# Patient Record
Sex: Female | Born: 1980 | Race: White | Hispanic: No | Marital: Single | Smoking: Never smoker
Health system: Southern US, Community
[De-identification: ages and names within clinical notes are randomized; demographics above are authoritative.]

## PROBLEM LIST (undated history)

## (undated) DIAGNOSIS — S83249A Other tear of medial meniscus, current injury, unspecified knee, initial encounter: Secondary | ICD-10-CM

## (undated) DIAGNOSIS — K219 Gastro-esophageal reflux disease without esophagitis: Secondary | ICD-10-CM

## (undated) DIAGNOSIS — R609 Edema, unspecified: Secondary | ICD-10-CM

## (undated) DIAGNOSIS — S83289A Other tear of lateral meniscus, current injury, unspecified knee, initial encounter: Secondary | ICD-10-CM

## (undated) DIAGNOSIS — R600 Localized edema: Secondary | ICD-10-CM

## (undated) DIAGNOSIS — S83519A Sprain of anterior cruciate ligament of unspecified knee, initial encounter: Secondary | ICD-10-CM

## (undated) DIAGNOSIS — R682 Dry mouth, unspecified: Secondary | ICD-10-CM

## (undated) HISTORY — PX: ANAL FISTULECTOMY: SHX1139

## (undated) HISTORY — PX: TONSILLECTOMY: SUR1361

## (undated) HISTORY — PX: KNEE ARTHROSCOPY: SHX127

---

## 2003-04-14 ENCOUNTER — Encounter: Admission: RE | Admit: 2003-04-14 | Discharge: 2003-04-14 | Payer: Self-pay | Admitting: Unknown Physician Specialty

## 2003-04-14 ENCOUNTER — Encounter: Payer: Self-pay | Admitting: Unknown Physician Specialty

## 2014-02-15 ENCOUNTER — Other Ambulatory Visit: Payer: Self-pay | Admitting: Orthopaedic Surgery

## 2014-02-15 DIAGNOSIS — M25562 Pain in left knee: Secondary | ICD-10-CM

## 2014-02-22 ENCOUNTER — Ambulatory Visit
Admission: RE | Admit: 2014-02-22 | Discharge: 2014-02-22 | Disposition: A | Discharge status: Home / Self Care | Payer: Medicaid Other | Source: Ambulatory Visit | Attending: Orthopaedic Surgery | Admitting: Orthopaedic Surgery

## 2014-02-22 DIAGNOSIS — M25562 Pain in left knee: Secondary | ICD-10-CM

## 2015-03-20 HISTORY — PX: DIAGNOSTIC LAPAROSCOPY: SUR761

## 2015-04-24 ENCOUNTER — Other Ambulatory Visit: Payer: Self-pay | Admitting: Orthopaedic Surgery

## 2015-04-24 DIAGNOSIS — M25562 Pain in left knee: Secondary | ICD-10-CM

## 2015-05-03 ENCOUNTER — Ambulatory Visit
Admission: RE | Admit: 2015-05-03 | Discharge: 2015-05-03 | Disposition: A | Discharge status: Home / Self Care | Payer: Medicaid Other | Source: Ambulatory Visit | Attending: Orthopaedic Surgery | Admitting: Orthopaedic Surgery

## 2015-05-03 DIAGNOSIS — M25562 Pain in left knee: Secondary | ICD-10-CM

## 2015-06-01 DIAGNOSIS — S83519A Sprain of anterior cruciate ligament of unspecified knee, initial encounter: Secondary | ICD-10-CM

## 2015-06-01 DIAGNOSIS — S83289A Other tear of lateral meniscus, current injury, unspecified knee, initial encounter: Secondary | ICD-10-CM

## 2015-06-01 DIAGNOSIS — S83249A Other tear of medial meniscus, current injury, unspecified knee, initial encounter: Secondary | ICD-10-CM

## 2015-06-01 HISTORY — DX: Sprain of anterior cruciate ligament of unspecified knee, initial encounter: S83.519A

## 2015-06-01 HISTORY — DX: Other tear of lateral meniscus, current injury, unspecified knee, initial encounter: S83.289A

## 2015-06-01 HISTORY — DX: Other tear of medial meniscus, current injury, unspecified knee, initial encounter: S83.249A

## 2015-06-09 ENCOUNTER — Other Ambulatory Visit (HOSPITAL_BASED_OUTPATIENT_CLINIC_OR_DEPARTMENT_OTHER): Payer: Self-pay | Admitting: Orthopaedic Surgery

## 2015-06-15 ENCOUNTER — Encounter (HOSPITAL_BASED_OUTPATIENT_CLINIC_OR_DEPARTMENT_OTHER): Payer: Self-pay | Admitting: *Deleted

## 2015-06-15 NOTE — Pre-Procedure Instructions (Signed)
To come for BMET 

## 2015-06-20 ENCOUNTER — Encounter (HOSPITAL_BASED_OUTPATIENT_CLINIC_OR_DEPARTMENT_OTHER)
Admission: RE | Admit: 2015-06-20 | Discharge: 2015-06-20 | Disposition: A | Discharge status: Home / Self Care | Payer: Medicaid Other | Source: Ambulatory Visit | Attending: Orthopaedic Surgery | Admitting: Orthopaedic Surgery

## 2015-06-20 DIAGNOSIS — Z6835 Body mass index (BMI) 35.0-35.9, adult: Secondary | ICD-10-CM | POA: Diagnosis not present

## 2015-06-20 DIAGNOSIS — K219 Gastro-esophageal reflux disease without esophagitis: Secondary | ICD-10-CM | POA: Diagnosis not present

## 2015-06-20 DIAGNOSIS — S83282A Other tear of lateral meniscus, current injury, left knee, initial encounter: Secondary | ICD-10-CM | POA: Diagnosis not present

## 2015-06-20 DIAGNOSIS — Z01818 Encounter for other preprocedural examination: Secondary | ICD-10-CM | POA: Diagnosis present

## 2015-06-20 DIAGNOSIS — S83242A Other tear of medial meniscus, current injury, left knee, initial encounter: Secondary | ICD-10-CM | POA: Diagnosis not present

## 2015-06-20 DIAGNOSIS — Z79899 Other long term (current) drug therapy: Secondary | ICD-10-CM | POA: Diagnosis not present

## 2015-06-20 DIAGNOSIS — S83512A Sprain of anterior cruciate ligament of left knee, initial encounter: Secondary | ICD-10-CM | POA: Diagnosis present

## 2015-06-20 DIAGNOSIS — X58XXXA Exposure to other specified factors, initial encounter: Secondary | ICD-10-CM | POA: Diagnosis not present

## 2015-06-20 LAB — POCT I-STAT, CHEM 8
BUN: 10 mg/dL (ref 6–20)
CALCIUM ION: 1.17 mmol/L (ref 1.12–1.23)
CHLORIDE: 101 mmol/L (ref 101–111)
Creatinine, Ser: 0.9 mg/dL (ref 0.44–1.00)
Glucose, Bld: 89 mg/dL (ref 65–99)
HCT: 42 % (ref 36.0–46.0)
Hemoglobin: 14.3 g/dL (ref 12.0–15.0)
POTASSIUM: 3.5 mmol/L (ref 3.5–5.1)
SODIUM: 139 mmol/L (ref 135–145)
TCO2: 25 mmol/L (ref 0–100)

## 2015-06-20 NOTE — Pre-Procedure Instructions (Signed)
[facility], was done 08/11/2014

## 2015-06-21 ENCOUNTER — Ambulatory Visit (HOSPITAL_BASED_OUTPATIENT_CLINIC_OR_DEPARTMENT_OTHER): Payer: Medicaid Other | Admitting: Anesthesiology

## 2015-06-21 ENCOUNTER — Encounter (HOSPITAL_BASED_OUTPATIENT_CLINIC_OR_DEPARTMENT_OTHER)
Admission: RE | Disposition: A | Discharge status: Home / Self Care | Payer: Self-pay | Source: Ambulatory Visit | Attending: Orthopaedic Surgery

## 2015-06-21 ENCOUNTER — Encounter (HOSPITAL_BASED_OUTPATIENT_CLINIC_OR_DEPARTMENT_OTHER): Payer: Self-pay | Admitting: Anesthesiology

## 2015-06-21 ENCOUNTER — Ambulatory Visit (HOSPITAL_BASED_OUTPATIENT_CLINIC_OR_DEPARTMENT_OTHER)
Admission: RE | Admit: 2015-06-21 | Discharge: 2015-06-22 | Disposition: A | Discharge status: Home / Self Care | Payer: Medicaid Other | Source: Ambulatory Visit | Attending: Orthopaedic Surgery | Admitting: Orthopaedic Surgery

## 2015-06-21 DIAGNOSIS — X58XXXA Exposure to other specified factors, initial encounter: Secondary | ICD-10-CM | POA: Insufficient documentation

## 2015-06-21 DIAGNOSIS — S83512A Sprain of anterior cruciate ligament of left knee, initial encounter: Secondary | ICD-10-CM | POA: Insufficient documentation

## 2015-06-21 DIAGNOSIS — Z79899 Other long term (current) drug therapy: Secondary | ICD-10-CM | POA: Insufficient documentation

## 2015-06-21 DIAGNOSIS — S83282A Other tear of lateral meniscus, current injury, left knee, initial encounter: Secondary | ICD-10-CM | POA: Insufficient documentation

## 2015-06-21 DIAGNOSIS — S83242A Other tear of medial meniscus, current injury, left knee, initial encounter: Secondary | ICD-10-CM | POA: Diagnosis not present

## 2015-06-21 DIAGNOSIS — K219 Gastro-esophageal reflux disease without esophagitis: Secondary | ICD-10-CM | POA: Diagnosis not present

## 2015-06-21 DIAGNOSIS — S83519A Sprain of anterior cruciate ligament of unspecified knee, initial encounter: Secondary | ICD-10-CM | POA: Diagnosis present

## 2015-06-21 DIAGNOSIS — Z6835 Body mass index (BMI) 35.0-35.9, adult: Secondary | ICD-10-CM | POA: Insufficient documentation

## 2015-06-21 HISTORY — DX: Gastro-esophageal reflux disease without esophagitis: K21.9

## 2015-06-21 HISTORY — DX: Dry mouth, unspecified: R68.2

## 2015-06-21 HISTORY — DX: Other tear of medial meniscus, current injury, unspecified knee, initial encounter: S83.249A

## 2015-06-21 HISTORY — DX: Edema, unspecified: R60.9

## 2015-06-21 HISTORY — DX: Localized edema: R60.0

## 2015-06-21 HISTORY — DX: Other tear of lateral meniscus, current injury, unspecified knee, initial encounter: S83.289A

## 2015-06-21 HISTORY — PX: KNEE ARTHROSCOPY WITH LATERAL MENISECTOMY: SHX6193

## 2015-06-21 HISTORY — DX: Sprain of anterior cruciate ligament of unspecified knee, initial encounter: S83.519A

## 2015-06-21 HISTORY — PX: ANTERIOR CRUCIATE LIGAMENT REPAIR: SHX115

## 2015-06-21 HISTORY — PX: KNEE ARTHROSCOPY WITH MEDIAL MENISECTOMY: SHX5651

## 2015-06-21 SURGERY — RECONSTRUCTION, KNEE, ACL
Anesthesia: General | Site: Knee | Laterality: Left

## 2015-06-21 MED ORDER — ONDANSETRON HCL 4 MG/2ML IJ SOLN
INTRAMUSCULAR | Status: DC | PRN
  Administered 2015-06-21: 4 mg via INTRAVENOUS

## 2015-06-21 MED ORDER — METHOCARBAMOL 750 MG PO TABS: 750.0000 mg | ORAL_TABLET | Freq: Two times a day (BID) | ORAL | Status: AC | PRN

## 2015-06-21 MED ORDER — ESMOLOL HCL 100 MG/10ML IV SOLN
INTRAVENOUS | Status: DC | PRN
  Administered 2015-06-21: 90 mg via INTRAVENOUS
  Administered 2015-06-21: 10 mg via INTRAVENOUS

## 2015-06-21 MED ORDER — DEXAMETHASONE SODIUM PHOSPHATE 4 MG/ML IJ SOLN
INTRAMUSCULAR | Status: DC | PRN
  Administered 2015-06-21: 10 mg via INTRAVENOUS

## 2015-06-21 MED ORDER — PROPOFOL 10 MG/ML IV BOLUS
INTRAVENOUS | Status: DC | PRN
Start: 2015-06-21 — End: 2015-06-21
  Administered 2015-06-21: 50 mg via INTRAVENOUS
  Administered 2015-06-21: 200 mg via INTRAVENOUS

## 2015-06-21 MED ORDER — MIDAZOLAM HCL 2 MG/2ML IJ SOLN
INTRAMUSCULAR | Status: AC
  Filled 2015-06-21: qty 2

## 2015-06-21 MED ORDER — SCOPOLAMINE 1 MG/3DAYS TD PT72
MEDICATED_PATCH | TRANSDERMAL | Status: AC
  Filled 2015-06-21: qty 1

## 2015-06-21 MED ORDER — SODIUM CHLORIDE 0.9 % IV SOLN
INTRAVENOUS | Status: DC
  Administered 2015-06-21: 14:00:00 via INTRAVENOUS

## 2015-06-21 MED ORDER — HYDROMORPHONE HCL 1 MG/ML IJ SOLN
INTRAMUSCULAR | Status: AC
  Filled 2015-06-21: qty 1

## 2015-06-21 MED ORDER — FENTANYL CITRATE (PF) 100 MCG/2ML IJ SOLN
INTRAMUSCULAR | Status: AC
  Filled 2015-06-21: qty 2

## 2015-06-21 MED ORDER — LIDOCAINE HCL (CARDIAC) 20 MG/ML IV SOLN
INTRAVENOUS | Status: AC
  Filled 2015-06-21: qty 5

## 2015-06-21 MED ORDER — GLYCOPYRROLATE 0.2 MG/ML IJ SOLN: 0.2000 mg | Freq: Once | INTRAMUSCULAR | Status: DC | PRN

## 2015-06-21 MED ORDER — DIPHENHYDRAMINE HCL 12.5 MG/5ML PO ELIX: 25.0000 mg | ORAL_SOLUTION | ORAL | Status: DC | PRN

## 2015-06-21 MED ORDER — SCOPOLAMINE 1 MG/3DAYS TD PT72
1.0000 | MEDICATED_PATCH | Freq: Once | TRANSDERMAL | Status: DC | PRN
  Administered 2015-06-21: 1.5 mg via TRANSDERMAL

## 2015-06-21 MED ORDER — ONDANSETRON HCL 4 MG/2ML IJ SOLN: 4.0000 mg | Freq: Four times a day (QID) | INTRAMUSCULAR | Status: DC | PRN

## 2015-06-21 MED ORDER — CEFAZOLIN SODIUM-DEXTROSE 2-3 GM-% IV SOLR
2.0000 g | Freq: Four times a day (QID) | INTRAVENOUS | Status: AC
  Administered 2015-06-21 – 2015-06-22 (×3): 2 g via INTRAVENOUS
  Filled 2015-06-21 (×3): qty 50

## 2015-06-21 MED ORDER — ONDANSETRON HCL 4 MG/2ML IJ SOLN
INTRAMUSCULAR | Status: AC
  Filled 2015-06-21: qty 2

## 2015-06-21 MED ORDER — LACTATED RINGERS IV SOLN
500.0000 mL | INTRAVENOUS | Status: DC
  Administered 2015-06-21 (×3): via INTRAVENOUS

## 2015-06-21 MED ORDER — ASPIRIN EC 325 MG PO TBEC: 325.0000 mg | DELAYED_RELEASE_TABLET | Freq: Two times a day (BID) | ORAL | Status: AC

## 2015-06-21 MED ORDER — EPINEPHRINE HCL 1 MG/ML IJ SOLN
INTRAMUSCULAR | Status: AC
  Filled 2015-06-21: qty 1

## 2015-06-21 MED ORDER — ACETAMINOPHEN 325 MG PO TABS: 650.0000 mg | ORAL_TABLET | Freq: Four times a day (QID) | ORAL | Status: DC | PRN

## 2015-06-21 MED ORDER — OXYCODONE HCL 5 MG PO TABS: 5.0000 mg | ORAL_TABLET | ORAL | Status: AC | PRN

## 2015-06-21 MED ORDER — CEFAZOLIN SODIUM-DEXTROSE 2-3 GM-% IV SOLR
INTRAVENOUS | Status: AC
  Filled 2015-06-21: qty 50

## 2015-06-21 MED ORDER — SODIUM CHLORIDE 0.9 % IR SOLN
Status: DC | PRN
  Administered 2015-06-21: 12000 mL

## 2015-06-21 MED ORDER — LIDOCAINE HCL (CARDIAC) 20 MG/ML IV SOLN
INTRAVENOUS | Status: DC | PRN
  Administered 2015-06-21: 50 mg via INTRAVENOUS

## 2015-06-21 MED ORDER — METOPROLOL TARTRATE 1 MG/ML IV SOLN
INTRAVENOUS | Status: DC | PRN
  Administered 2015-06-21: 2.5 mg via INTRAVENOUS

## 2015-06-21 MED ORDER — METOPROLOL TARTRATE 1 MG/ML IV SOLN
INTRAVENOUS | Status: AC
  Filled 2015-06-21: qty 5

## 2015-06-21 MED ORDER — BUPIVACAINE HCL (PF) 0.25 % IJ SOLN
INTRAMUSCULAR | Status: DC | PRN
  Administered 2015-06-21: 15 mL via INTRA_ARTICULAR

## 2015-06-21 MED ORDER — LACTATED RINGERS IV SOLN
INTRAVENOUS | Status: DC
  Administered 2015-06-21: 09:00:00 via INTRAVENOUS

## 2015-06-21 MED ORDER — LISINOPRIL-HYDROCHLOROTHIAZIDE 10-12.5 MG PO TABS: 1.0000 | ORAL_TABLET | Freq: Every day | ORAL | Status: DC

## 2015-06-21 MED ORDER — OXYCODONE HCL 5 MG PO TABS: 5.0000 mg | ORAL_TABLET | Freq: Once | ORAL | Status: DC | PRN

## 2015-06-21 MED ORDER — MIDAZOLAM HCL 5 MG/5ML IJ SOLN
INTRAMUSCULAR | Status: DC | PRN
  Administered 2015-06-21: 2 mg via INTRAVENOUS

## 2015-06-21 MED ORDER — ONDANSETRON HCL 4 MG PO TABS: 4.0000 mg | ORAL_TABLET | Freq: Four times a day (QID) | ORAL | Status: DC | PRN

## 2015-06-21 MED ORDER — PROMETHAZINE HCL 25 MG/ML IJ SOLN
6.2500 mg | Freq: Four times a day (QID) | INTRAMUSCULAR | Status: DC | PRN
  Administered 2015-06-21 (×2): 6.25 mg via INTRAVENOUS
  Filled 2015-06-21: qty 1

## 2015-06-21 MED ORDER — FENTANYL CITRATE (PF) 100 MCG/2ML IJ SOLN
INTRAMUSCULAR | Status: DC | PRN
  Administered 2015-06-21 (×2): 50 ug via INTRAVENOUS
  Administered 2015-06-21: 100 ug via INTRAVENOUS
  Administered 2015-06-21: 50 ug via INTRAVENOUS
  Administered 2015-06-21: 25 ug via INTRAVENOUS

## 2015-06-21 MED ORDER — METHOCARBAMOL 1000 MG/10ML IJ SOLN: 500.0000 mg | Freq: Four times a day (QID) | INTRAVENOUS | Status: DC | PRN

## 2015-06-21 MED ORDER — BUPROPION HCL ER (SR) 100 MG PO TB12: 200.0000 mg | ORAL_TABLET | Freq: Every day | ORAL | Status: DC

## 2015-06-21 MED ORDER — ACETAMINOPHEN 650 MG RE SUPP: 650.0000 mg | Freq: Four times a day (QID) | RECTAL | Status: DC | PRN

## 2015-06-21 MED ORDER — PROPOFOL 10 MG/ML IV BOLUS
INTRAVENOUS | Status: AC
  Filled 2015-06-21: qty 40

## 2015-06-21 MED ORDER — MORPHINE SULFATE (PF) 2 MG/ML IV SOLN: 1.0000 mg | INTRAVENOUS | Status: DC | PRN

## 2015-06-21 MED ORDER — OXYCODONE HCL 5 MG/5ML PO SOLN
5.0000 mg | Freq: Once | ORAL | Status: DC | PRN
  Filled 2015-06-21: qty 5

## 2015-06-21 MED ORDER — CEFAZOLIN SODIUM-DEXTROSE 2-3 GM-% IV SOLR
2.0000 g | INTRAVENOUS | Status: AC
  Administered 2015-06-21: 2 g via INTRAVENOUS

## 2015-06-21 MED ORDER — BUPIVACAINE-EPINEPHRINE (PF) 0.5% -1:200000 IJ SOLN
INTRAMUSCULAR | Status: DC | PRN
  Administered 2015-06-21: 30 mL via PERINEURAL

## 2015-06-21 MED ORDER — HYDROMORPHONE HCL 1 MG/ML IJ SOLN
0.2500 mg | INTRAMUSCULAR | Status: DC | PRN
  Administered 2015-06-21 – 2015-06-22 (×5): 0.5 mg via INTRAVENOUS
  Filled 2015-06-21: qty 1

## 2015-06-21 MED ORDER — KETOROLAC TROMETHAMINE 15 MG/ML IJ SOLN: 30.0000 mg | Freq: Four times a day (QID) | INTRAMUSCULAR | Status: DC | PRN

## 2015-06-21 MED ORDER — ASPIRIN EC 325 MG PO TBEC: 325.0000 mg | DELAYED_RELEASE_TABLET | Freq: Two times a day (BID) | ORAL | Status: DC

## 2015-06-21 MED ORDER — OXYCODONE HCL ER 10 MG PO T12A
10.0000 mg | EXTENDED_RELEASE_TABLET | Freq: Two times a day (BID) | ORAL | Status: DC
  Administered 2015-06-21 – 2015-06-22 (×2): 10 mg via ORAL
  Filled 2015-06-21 (×2): qty 1

## 2015-06-21 MED ORDER — MIDAZOLAM HCL 2 MG/2ML IJ SOLN
1.0000 mg | INTRAMUSCULAR | Status: DC | PRN
  Administered 2015-06-21: 2 mg via INTRAVENOUS

## 2015-06-21 MED ORDER — PROMETHAZINE HCL 25 MG/ML IJ SOLN
INTRAMUSCULAR | Status: AC
  Filled 2015-06-21: qty 1

## 2015-06-21 MED ORDER — FENTANYL CITRATE (PF) 100 MCG/2ML IJ SOLN
50.0000 ug | INTRAMUSCULAR | Status: DC | PRN
  Administered 2015-06-21: 100 ug via INTRAVENOUS

## 2015-06-21 MED ORDER — MEPERIDINE HCL 25 MG/ML IJ SOLN: 6.2500 mg | INTRAMUSCULAR | Status: DC | PRN

## 2015-06-21 MED ORDER — METHOCARBAMOL 500 MG PO TABS
500.0000 mg | ORAL_TABLET | Freq: Four times a day (QID) | ORAL | Status: DC | PRN
  Administered 2015-06-21: 500 mg via ORAL
  Filled 2015-06-21: qty 1

## 2015-06-21 MED ORDER — OXYCODONE HCL 5 MG PO TABS
5.0000 mg | ORAL_TABLET | ORAL | Status: DC | PRN
  Administered 2015-06-21: 10 mg via ORAL
  Administered 2015-06-21: 5 mg via ORAL
  Administered 2015-06-22: 10 mg via ORAL
  Filled 2015-06-21: qty 2
  Filled 2015-06-21: qty 1
  Filled 2015-06-21: qty 2

## 2015-06-21 MED ORDER — KETOROLAC TROMETHAMINE 30 MG/ML IJ SOLN
INTRAMUSCULAR | Status: DC | PRN
  Administered 2015-06-21: 30 mg via INTRAVENOUS

## 2015-06-21 MED ORDER — DEXAMETHASONE SODIUM PHOSPHATE 10 MG/ML IJ SOLN
INTRAMUSCULAR | Status: AC
  Filled 2015-06-21: qty 1

## 2015-06-21 MED ORDER — METOCLOPRAMIDE HCL 5 MG PO TABS: 5.0000 mg | ORAL_TABLET | Freq: Three times a day (TID) | ORAL | Status: DC | PRN

## 2015-06-21 MED ORDER — LORAZEPAM 1 MG PO TABS: 1.0000 mg | ORAL_TABLET | Freq: Three times a day (TID) | ORAL | Status: DC | PRN

## 2015-06-21 MED ORDER — ONDANSETRON HCL 4 MG PO TABS: 4.0000 mg | ORAL_TABLET | Freq: Three times a day (TID) | ORAL | Status: AC | PRN

## 2015-06-21 MED ORDER — METOCLOPRAMIDE HCL 5 MG/ML IJ SOLN: 5.0000 mg | Freq: Three times a day (TID) | INTRAMUSCULAR | Status: DC | PRN

## 2015-06-21 MED ORDER — DULOXETINE HCL 60 MG PO CPEP
60.0000 mg | ORAL_CAPSULE | Freq: Two times a day (BID) | ORAL | Status: DC
  Administered 2015-06-21: 60 mg via ORAL

## 2015-06-21 MED ORDER — OXYCODONE HCL ER 10 MG PO T12A: 10.0000 mg | EXTENDED_RELEASE_TABLET | Freq: Two times a day (BID) | ORAL | Status: AC

## 2015-06-21 MED ORDER — SENNOSIDES-DOCUSATE SODIUM 8.6-50 MG PO TABS: 1.0000 | ORAL_TABLET | Freq: Every evening | ORAL | Status: AC | PRN

## 2015-06-21 SURGICAL SUPPLY — 108 items
ALLOGRAFT GRFTLNK IMPLANT SYST (Anchor) IMPLANT
BANDAGE ELASTIC 6 VELCRO ST LF (GAUZE/BANDAGES/DRESSINGS) ×5 IMPLANT
BANDAGE ESMARK 6X9 LF (GAUZE/BANDAGES/DRESSINGS) ×1 IMPLANT
BLADE 4.2CUDA (BLADE) ×1 IMPLANT
BLADE CUDA 5.5 (BLADE) IMPLANT
BLADE CUDA GRT WHITE 3.5 (BLADE) ×2 IMPLANT
BLADE CUTTER GATOR 3.5 (BLADE) IMPLANT
BLADE CUTTER MENIS 5.5 (BLADE) IMPLANT
BLADE GREAT WHITE 4.2 (BLADE) IMPLANT
BLADE GREAT WHITE 4.2MM (BLADE)
BLADE SURG 15 STRL LF DISP TIS (BLADE) ×1 IMPLANT
BLADE SURG 15 STRL SS (BLADE) ×3
BNDG CMPR 9X6 STRL LF SNTH (GAUZE/BANDAGES/DRESSINGS) ×1
BNDG ESMARK 6X9 LF (GAUZE/BANDAGES/DRESSINGS) ×3
BUR OVAL 4.0 (BURR) ×2 IMPLANT
BUR OVAL 6.0 (BURR) ×1 IMPLANT
CLOSURE WOUND 1/2 X4 (GAUZE/BANDAGES/DRESSINGS) ×1
COVER BACK TABLE 60X90IN (DRAPES) ×3 IMPLANT
CUFF TOURNIQUET SINGLE 34IN LL (TOURNIQUET CUFF) ×2 IMPLANT
CUTTER FLIP II 9.5MM (INSTRUMENTS) ×2 IMPLANT
CUTTER KNOT PUSHER 2-0 FIBERWI (INSTRUMENTS) IMPLANT
CUTTER MENISCUS  4.2MM (BLADE)
CUTTER MENISCUS 4.2MM (BLADE) IMPLANT
DECANTER SPIKE VIAL GLASS SM (MISCELLANEOUS) ×2 IMPLANT
DRAPE ARTHROSCOPY W/POUCH 90 (DRAPES) ×3 IMPLANT
DRAPE OEC MINIVIEW 54X84 (DRAPES) ×1 IMPLANT
DRAPE SURG 17X23 STRL (DRAPES) ×4 IMPLANT
DRAPE U 20/CS (DRAPES) ×2 IMPLANT
DRAPE U-SHAPE 47X51 STRL (DRAPES) ×3 IMPLANT
DRILL FLIPCUTTER II 10.5MM (CUTTER) IMPLANT
DRILL FLIPCUTTER II 10MM (CUTTER) IMPLANT
DRILL FLIPCUTTER II 7.0MM (INSTRUMENTS) IMPLANT
DRILL FLIPCUTTER II 7.5MM (MISCELLANEOUS) IMPLANT
DRILL FLIPCUTTER II 8.0MM (INSTRUMENTS) IMPLANT
DRILL FLIPCUTTER II 8.5MM (INSTRUMENTS) IMPLANT
DRILL FLIPCUTTER II 9.0MM (INSTRUMENTS) IMPLANT
DRSG EMULSION OIL 3X3 NADH (GAUZE/BANDAGES/DRESSINGS) ×3 IMPLANT
DRSG PAD ABDOMINAL 8X10 ST (GAUZE/BANDAGES/DRESSINGS) ×3 IMPLANT
DURAPREP 26ML APPLICATOR (WOUND CARE) ×3 IMPLANT
ELECT REM PT RETURN 9FT ADLT (ELECTROSURGICAL) ×3
ELECTRODE REM PT RTRN 9FT ADLT (ELECTROSURGICAL) ×1 IMPLANT
FIBERSTICK 2 (SUTURE) ×2 IMPLANT
FLIP CUTTER II 7.0MM (INSTRUMENTS)
FLIPCUTTER II 10.5MM (CUTTER)
FLIPCUTTER II 10MM (CUTTER)
FLIPCUTTER II 7.5MM (MISCELLANEOUS)
FLIPCUTTER II 8.0MM (INSTRUMENTS)
FLIPCUTTER II 8.5MM (INSTRUMENTS)
FLIPCUTTER II 9.0MM (INSTRUMENTS)
GAUZE SPONGE 4X4 12PLY STRL (GAUZE/BANDAGES/DRESSINGS) ×3 IMPLANT
GLOVE BIOGEL PI IND STRL 7.0 (GLOVE) IMPLANT
GLOVE BIOGEL PI INDICATOR 7.0 (GLOVE) ×4
GLOVE NEODERM STRL 7.5 LF PF (GLOVE) ×1 IMPLANT
GLOVE SURG NEODERM 7.5  LF PF (GLOVE) ×6
GLOVE SURG SS PI 7.0 STRL IVOR (GLOVE) ×4 IMPLANT
GLOVE SURG SYN 7.5  E (GLOVE) ×6
GLOVE SURG SYN 7.5 E (GLOVE) ×3 IMPLANT
GLOVE SURG SYN 7.5 PF PI (GLOVE) ×1 IMPLANT
GOWN STRL REIN XL XLG (GOWN DISPOSABLE) ×5 IMPLANT
GOWN STRL REUS W/ TWL LRG LVL3 (GOWN DISPOSABLE) ×2 IMPLANT
GOWN STRL REUS W/TWL LRG LVL3 (GOWN DISPOSABLE) ×6
GRAFT TISS 60-80 FRZN TENDON (Tissue) IMPLANT
GUIDEPIN REAMER CUTTER 11MM (INSTRUMENTS) IMPLANT
IMMOBILIZER KNEE 22 UNIV (SOFTGOODS) ×2 IMPLANT
IMMOBILIZER KNEE 24 THIGH 36 (MISCELLANEOUS) IMPLANT
IMMOBILIZER KNEE 24 UNIV (MISCELLANEOUS)
IV NS IRRIG 3000ML ARTHROMATIC (IV SOLUTION) ×8 IMPLANT
KIT TRANSTIBIAL (DISPOSABLE) IMPLANT
KNEE WRAP E Z 3 GEL PACK (MISCELLANEOUS) ×5 IMPLANT
LOOP 2 FIBERLINK CLOSED (SUTURE) IMPLANT
MANIFOLD NEPTUNE II (INSTRUMENTS) ×3 IMPLANT
NS IRRIG 1000ML POUR BTL (IV SOLUTION) ×3 IMPLANT
PACK ARTHROSCOPY DSU (CUSTOM PROCEDURE TRAY) ×3 IMPLANT
PACK BASIN DAY SURGERY FS (CUSTOM PROCEDURE TRAY) ×3 IMPLANT
PAD CAST 4YDX4 CTTN HI CHSV (CAST SUPPLIES) ×1 IMPLANT
PADDING CAST COTTON 4X4 STRL (CAST SUPPLIES)
PADDING CAST COTTON 6X4 STRL (CAST SUPPLIES) ×3 IMPLANT
PENCIL BUTTON HOLSTER BLD 10FT (ELECTRODE) IMPLANT
PIN DRILL ACL TIGHTROPE 4MM (PIN) IMPLANT
PK GRAFTLINK ALLO IMPLANT SYST (Anchor) ×3 IMPLANT
RESECTOR FULL RADIUS 4.2MM (BLADE) ×2 IMPLANT
SET ARTHROSCOPY TUBING (MISCELLANEOUS) ×3
SET ARTHROSCOPY TUBING LN (MISCELLANEOUS) ×1 IMPLANT
SLEEVE SCD COMPRESS KNEE MED (MISCELLANEOUS) ×2 IMPLANT
SPONGE LAP 4X18 X RAY DECT (DISPOSABLE) IMPLANT
STRIP CLOSURE SKIN 1/2X4 (GAUZE/BANDAGES/DRESSINGS) ×2 IMPLANT
SUCTION FRAZIER TIP 10 FR DISP (SUCTIONS) IMPLANT
SUT 2 FIBERLOOP 20 STRT BLUE (SUTURE)
SUT ETHILON 3 0 PS 1 (SUTURE) ×1 IMPLANT
SUT FIBERWIRE #2 38 T-5 BLUE (SUTURE)
SUT FIBERWIRE 2-0 18 17.9 3/8 (SUTURE)
SUT MNCRL AB 4-0 PS2 18 (SUTURE) ×2 IMPLANT
SUT MON AB 2-0 CT1 36 (SUTURE) ×1 IMPLANT
SUT VIC AB 2-0 SH 27 (SUTURE) ×3
SUT VIC AB 2-0 SH 27XBRD (SUTURE) IMPLANT
SUT VIC AB 3-0 SH 27 (SUTURE)
SUT VIC AB 3-0 SH 27X BRD (SUTURE) IMPLANT
SUT VICRYL 4-0 PS2 18IN ABS (SUTURE) IMPLANT
SUTURE 2 FIBERLOOP 20 STRT BLU (SUTURE) IMPLANT
SUTURE FIBERWR #2 38 T-5 BLUE (SUTURE) IMPLANT
SUTURE FIBERWR 2-0 18 17.9 3/8 (SUTURE) IMPLANT
SUTURE TIGERSTICK 2 TIGERWIR 2 (MISCELLANEOUS) IMPLANT
TIGERSTICK 2 TIGERWIRE 2 (MISCELLANEOUS)
TISSUE GRAFTLINK FGL (Tissue) ×3 IMPLANT
TOWEL OR 17X24 6PK STRL BLUE (TOWEL DISPOSABLE) ×6 IMPLANT
TOWEL OR NON WOVEN STRL DISP B (DISPOSABLE) ×3 IMPLANT
WAND STAR VAC 90 (SURGICAL WAND) ×3 IMPLANT
WATER STERILE IRR 1000ML POUR (IV SOLUTION) ×3 IMPLANT

## 2015-06-21 NOTE — Anesthesia Postprocedure Evaluation (Signed)
Anesthesia Post Note  Patient: Tempie Hoistoni Marcoux  Procedure(s) Performed: Procedure(s) (LRB): LEFT KNEE ARTHROSCOPY WITH ANTERIOR CRUCIATE LIGAMENT (ACL) RECONSTRUCTION WITH GRAFTLINK ALLOGRAFT, PARTIAL MEDIAL MENISCECTOMY AND PARTIAL LATERAL MENISCECTOMY (Left) KNEE ARTHROSCOPY WITH LATERAL MENISECTOMY (Left) KNEE ARTHROSCOPY WITH MEDIAL MENISECTOMY (Left)  Patient location during evaluation: PACU Anesthesia Type: General Level of consciousness: awake and alert Pain management: pain level controlled Vital Signs Assessment: post-procedure vital signs reviewed and stable Respiratory status: spontaneous breathing, nonlabored ventilation and respiratory function stable Cardiovascular status: blood pressure returned to baseline and stable Postop Assessment: no signs of nausea or vomiting Anesthetic complications: no    Last Vitals:  Filed Vitals:   06/21/15 1340 06/21/15 1400  BP: 144/77 142/91  Pulse: 98 98  Temp:  36.1 C  Resp: 12 12    Last Pain:  Filed Vitals:   06/21/15 1408  PainSc: 3                  Jereme Loren A

## 2015-06-21 NOTE — Progress Notes (Signed)
Assisted Dr. Crews with left, ultrasound guided, femoral block. Side rails up, monitors on throughout procedure. See vital signs in flow sheet. Tolerated Procedure well. 

## 2015-06-21 NOTE — Discharge Instructions (Signed)
Post-operative patient instructions Knee ACL Reconstruction   Left knee ACL reconstruction with partial medial and lateral menisectomy. You also had arthroscopic debridement of inflammation tissue, a small area of cartilage trimming (chondroplasty) of the groove for kneecap, and partial lateral meniscectomy.  Ice: Place intermittent ice or cooler pack over your knee, 30 minutes on and 30 minutes off. Continue this for the first 72 hours after surgery, then save ice for use after therapy sessions or on more active days.  Weight: You may place weight on your leg as your symptoms allow (with brace and crutches)  Brace: You have a knee brace locked holding your knee straight placed over your surgical dressings. Keep brace on for walking until physical therapist or physician sees your strength and leg control improve and discontinues brace. Wear brace also at night to help with knee straightening.  Crutches: Use crutches to assist in walking until told to discontinue by your physical therapist or physician. This will help to reduce pain.  Strengthening: Perform simple thigh squeezes (isometric quad contractions) and straight leg lifts as you are able (3 sets of 5 to 10 repetitions, 3 times a day). For the leg lifts, have someone support under your ankle in the beginning until you have increased strength enough to do this on your own. To help get started on thigh squeezes, place a pillow under your knee and push down on the pillow with back of knee (sometimes easier to do than with your leg fully straight).  Motion: Perform gentle knee motion as tolerated - this is gentle bending and straightening of the knee. Seated heel slides: you can start by sitting in a chair, remove your brace, and gently slide your heel back on the floor - allowing your knee to bend. Have someone help you straighten your knee (or use your other leg/foot hooked under your ankle. Also spend time working on knee straightening by placing a  folded towel under your foot/heel and allow gravity to help knee fall straight (5-10 min at a time 3-4 x per day)  Dressing: Perform 1st dressing change at 4-5 days postoperative. A moderate amount of blood tinged drainage is to be expected. So if you bleed through the dressing on the first or second day or if you have fevers, it is fine to change the dressing/check the wounds early, recover with new gauze and tegaderm (water resistant) as shown by MD, and rewrap with an ace bandage. Elevate your leg. If it bleeds through again, or if the incisions are leaking frank blood, please call the office. May change dressing every 1-2 days thereafter to check wound appearance. Many pharmacies have a similar water resistant dressing (ex 33M Nexcare).  Shower: Keep wounds dry and covered x 14 days. Do not get wound wet until sutures removed. MD has provided you with initial tegaderm dressing supplies to place over simple dry gauze applied to incisions.  Pain medication: A narcotic pain medication has been prescribed. Take as directed. Typically you need narcotic pain medication more regularly during the first 3 to 5 days after surgery. Decrease your use of the medication as the pain improves. Narcotics can sometimes cause constipation, even after a few doses. If you have problems with constipation, you can take an over the counter stool softener or light laxative. If you have persistent problems, please notify your physicians office.  Physical therapy: Additonal activity guidelines to be provided by your physician or physical therapist at follow-up visits.  Call [phone] for questions or problems.  Evenings you will be forwarded to the hospital operator. Ask for the orthopaedic physician on call. Please call if you experience:  Redness, cloudy, or foul smelling drainage at the surgical site  worsening knee pain and swelling not responsive to medication  any calf pain and or swelling of the lower leg and foot    medication intolerance  temperature greater than 100.5*F, especially during the first 3-4 weeks post surgical  other questions or concerns Thank you for allowing [place] to be a part of your care.     Regional Anesthesia Blocks  1. Numbness or the inability to move the "blocked" extremity may last from 3-48 hours after placement. The length of time depends on the medication injected and your individual response to the medication. If the numbness is not going away after 48 hours, call your surgeon.  2. The extremity that is blocked will need to be protected until the numbness is gone and the  Strength has returned. Because you cannot feel it, you will need to take extra care to avoid injury. Because it may be weak, you may have difficulty moving it or using it. You may not know what position it is in without looking at it while the block is in effect.  3. For blocks in the legs and feet, returning to weight bearing and walking needs to be done carefully. You will need to wait until the numbness is entirely gone and the strength has returned. You should be able to move your leg and foot normally before you try and bear weight or walk. You will need someone to be with you when you first try to ensure you do not fall and possibly risk injury.  4. Bruising and tenderness at the needle site are common side effects and will resolve in a few days.  5. Persistent numbness or new problems with movement should be communicated to the surgeon or the [facility] [phone] [facility] [phone]).

## 2015-06-21 NOTE — Transfer of Care (Signed)
Immediate Anesthesia Transfer of Care Note  Patient: Laura Zimmerman  Procedure(s) Performed: Procedure(s): LEFT KNEE ARTHROSCOPY WITH ANTERIOR CRUCIATE LIGAMENT (ACL) RECONSTRUCTION WITH GRAFTLINK ALLOGRAFT, PARTIAL MEDIAL MENISCECTOMY AND PARTIAL LATERAL MENISCECTOMY (Left) KNEE ARTHROSCOPY WITH LATERAL MENISECTOMY (Left) KNEE ARTHROSCOPY WITH MEDIAL MENISECTOMY (Left)  Patient Location: PACU  Anesthesia Type:GA combined with regional for post-op pain  Level of Consciousness: sedated  Airway & Oxygen Therapy: Patient Spontanous Breathing and Patient connected to face mask oxygen  Post-op Assessment: Report given to RN and Post -op Vital signs reviewed and stable  Post vital signs: Reviewed and stable  Last Vitals:  Filed Vitals:   06/21/15 0915 06/21/15 0930  BP:  157/91  Pulse: 87 105  Temp:    Resp: 14 20    Complications: No apparent anesthesia complications

## 2015-06-21 NOTE — Anesthesia Preprocedure Evaluation (Addendum)
Anesthesia Evaluation  Patient identified by MRN, date of birth, ID band Patient awake    Reviewed: Allergy & Precautions, NPO status , Patient's Chart, lab work & pertinent test results  Airway Mallampati: I  TM Distance: >3 FB Neck ROM: Full    Dental  (+) Teeth Intact, Dental Advisory Given   Pulmonary    breath sounds clear to auscultation       Cardiovascular  Rhythm:Regular Rate:Normal     Neuro/Psych    GI/Hepatic GERD  ,  Endo/Other  Morbid obesity  Renal/GU      Musculoskeletal   Abdominal   Peds  Hematology   Anesthesia Other Findings   Reproductive/Obstetrics                            Anesthesia Physical Anesthesia Plan  ASA: II  Anesthesia Plan: General   Post-op Pain Management:    Induction: Intravenous  Airway Management Planned: LMA  Additional Equipment:   Intra-op Plan:   Post-operative Plan: Extubation in OR  Informed Consent: I have reviewed the patients History and Physical, chart, labs and discussed the procedure including the risks, benefits and alternatives for the proposed anesthesia with the patient or authorized representative who has indicated his/her understanding and acceptance.   Dental advisory given  Plan Discussed with: CRNA, Anesthesiologist and Surgeon  Anesthesia Plan Comments:         Anesthesia Quick Evaluation

## 2015-06-21 NOTE — Progress Notes (Signed)
Called [place], Ortho Tech at [facility] [phone] for brace ordered by Dr. Roda ShuttersXu for patient.  Order released.

## 2015-06-21 NOTE — Op Note (Signed)
   Date of surgery: 06/21/2015  Preoperative diagnosis: Left anterior cruciate ligament tear, medial and lateral meniscus tears  Postoperative diagnosis: Same  Procedure: 1. Arthroscopic anterior cruciate ligament reconstruction with allograft, left knee 2. Arthroscopic partial medial and lateral meniscectomies 3. Arthroscopic extensive synovectomy of left knee  Surgeon: Glee ArvinMichael Dellie Piasecki, M.D.  Anesthesia: Gen. and regional  Estimated blood loss: Minimal  Tourniquet time: Less than 2 hours  Complications: None  Condition to PACU: Stable  Indications for procedure: Laura Zimmerman is a 34 year old female who presents today for surgical treatment above-mentioned conditions. She is made aware of the risks, benefits, and alternatives to surgery and she wished proceed. Consent was obtained.  Description of procedure: Patient was brought back to the operating room where she was placed supine on the table. General anesthesia was induced. Nonsterile tourniquet was placed on the upper left thigh. Operative sherry was prepped and draped in standard sterile fashion. Timeout was performed. Preoperative antibiotic for given. A preoperative exam of the left knee was performed and showed unstable Lachman's exam. She also had a positive pivot shift test. She has stable anterior drawer. The allograft was prepped on the back table prior to the patient entrance into the room. The extremity was exsanguinated with Esmarch bandage and the tourniquet was inflated to 300 mmHg. The standard anteromedial anterolateral arthroscopy portals were created. We first visualized the complete anterior cruciate ligament rupture. This was probed. The remnant of the anterior cruciate ligament was excised using an oscillating shaver and ArthroCare wand. The tibial and femoral footprints were prepared. We then turned our attention to the meniscus tears. We first visualized a small radial tear of the posterior horn of the medial meniscus.  This was probed. This was debrided back to a stable base. We then found a similar small radial tear of the lateral meniscus and was treated in a similar fashion. We also performed extensive synovectomy of the knee joint with an oscillating shaver.  We then turned our attention to reconstruction of the anterior cruciate ligament. A notchplasty was performed with a high-speed bur. We then used the drill guide for the femoral tunnel in order to create a 9.5 mm tunnel. The femoral tunnel was placed approximately 4 mm anterior to the back wall of the lateral femoral condyle. Tibial tunnel was placed right at the footprint of the original anterior cruciate ligament. We then performed the same thing for the tibial side. The anterior cruciate ligament graft was then passed through the medial arthroscopy portal and into the femoral tunnel under arthroscopic visualization. The flip button was flipped and tensioned appropriately. We were able to place approximately 15 mm of the graft into the femoral tunnel. We then delivered the tibial end of the graft into the tibial tunnel and then tensioned. The anterior cruciate ligament was then cycled. A Lockman's test show that it was now stable to testing.  She now had a negative pivot shift test.  The sutures were then tied down once the graft was tensioned. Final arthroscopy pictures were taken. The wounds were thoroughly irrigated and closed in a layered fashion using 2-0 Vicryl and 4-0 Monocryl. There is strips were applied. Sterile dressings were applied. Patient tolerated the procedure well was x-rayed and transferred to the PACU in stable condition.  Postoperative plan: Patient will be weight bear as tolerated and a locked Bledsoe brace. She will be discharged home in the morning.

## 2015-06-21 NOTE — Progress Notes (Signed)
Orthopedic Tech Progress Note Patient Details:  Laura Zimmerman Dec 16, 1980 [id number]  PatiTempie Hoistent ID: Laura Zimmerman, female   DOB: Dec 16, 1980, 34 y.o.   MRN: [id number] Called in bio-tech brace order; spoke with Richardean Chimeraathy  Shermar Friedland 06/21/2015, 1:30 PM

## 2015-06-21 NOTE — H&P (Signed)
    PREOPERATIVE H&P  Chief Complaint: Left knee anterior cruciate ligament tear, medial meniscal tear, lateral meniscal tear  HPI: Laura Zimmerman is a 34 y.o. female who presents for surgical treatment of Left knee anterior cruciate ligament tear, medial meniscal tear, lateral meniscal tear.  She denies any changes in medical history.  Past Medical History  Diagnosis Date  . GERD (gastroesophageal reflux disease)   . Peripheral edema     lower extremities - states no HTN  . ACL tear 06/2015  . Medial meniscus tear 06/2015    left knee  . Lateral meniscal tear 06/2015    left knee  . Dry mouth    Past Surgical History  Procedure Laterality Date  . Tonsillectomy    . Anal fistulectomy    . Knee arthroscopy Right     x 4  . Diagnostic laparoscopy  03/20/2015   Social History   Social History  . Marital Status: Single    Spouse Name: N/A  . Number of Children: N/A  . Years of Education: N/A   Social History Main Topics  . Smoking status: Never Smoker   . Smokeless tobacco: Never Used  . Alcohol Use: No  . Drug Use: No  . Sexual Activity: Not Asked   Other Topics Concern  . None   Social History Narrative   History reviewed. No pertinent family history. Allergies  Allergen Reactions  . Latex Hives    BLISTERS   Prior to Admission medications   Medication Sig Start Date End Date Taking? Authorizing Provider  buPROPion (WELLBUTRIN SR) 200 MG 12 hr tablet Take 200 mg by mouth daily.   Yes Historical Provider, MD  DULoxetine (CYMBALTA) 60 MG capsule Take 60 mg by mouth 2 (two) times daily.   Yes Historical Provider, MD  etonogestrel-ethinyl estradiol (NUVARING) 0.12-0.015 MG/24HR vaginal ring Place 1 each vaginally every 28 (twenty-eight) days. Insert vaginally and leave in place for 3 consecutive weeks, then remove for 1 week.   Yes Historical Provider, MD  ibuprofen (ADVIL,MOTRIN) 200 MG tablet Take 200 mg by mouth every 6 (six) hours as needed.   Yes Historical  Provider, MD  lisinopril-hydrochlorothiazide (PRINZIDE,ZESTORETIC) 10-12.5 MG tablet Take 1 tablet by mouth daily.   Yes Historical Provider, MD  ranitidine (ZANTAC) 150 MG tablet Take 150 mg by mouth 2 (two) times daily.   Yes Historical Provider, MD     Positive ROS: All other systems have been reviewed and were otherwise negative with the exception of those mentioned in the HPI and as above.  Physical Exam: General: Alert, no acute distress Cardiovascular: No pedal edema Respiratory: No cyanosis, no use of accessory musculature GI: abdomen soft Skin: No lesions in the area of chief complaint Neurologic: Sensation intact distally Psychiatric: Patient is competent for consent with normal mood and affect Lymphatic: no lymphedema  MUSCULOSKELETAL: exam stable  Assessment: Left knee anterior cruciate ligament tear, medial meniscal tear, lateral meniscal tear  Plan: Plan for Procedure(s): LEFT KNEE ARTHROSCOPY WITH ANTERIOR CRUCIATE LIGAMENT (ACL) RECONSTRUCTION WITH HAMSTRING AUTOGRAFT, PARTIAL MEDIAL MENISCECTOMY AND PARTIAL LATERAL MENISCECTOMY  The risks benefits and alternatives were discussed with the patient including but not limited to the risks of nonoperative treatment, versus surgical intervention including infection, bleeding, nerve injury,  blood clots, cardiopulmonary complications, morbidity, mortality, among others, and they were willing to proceed.   Cheral AlmasXu, Naiping Michael, MD   06/21/2015 7:17 AM

## 2015-06-21 NOTE — Anesthesia Procedure Notes (Addendum)
Anesthesia Regional Block:  Femoral nerve block  Pre-Anesthetic Checklist: ,, timeout performed, Correct Patient, Correct Site, Correct Laterality, Correct Procedure, Correct Position, site marked, Risks and benefits discussed,  Surgical consent,  Pre-op evaluation,  At surgeon's request and post-op pain management  Laterality: Left and Lower  Prep: chloraprep       Needles:  Injection technique: Single-shot  Needle Type: Echogenic Needle     Needle Length: 9cm 9 cm Needle Gauge: 21 and 21 G    Additional Needles:  Procedures: ultrasound guided (picture in chart) Femoral nerve block Narrative:  Start time: 06/21/2015 9:18 AM End time: 06/21/2015 9:23 AM Injection made incrementally with aspirations every 5 mL.  Performed by: Personally  Anesthesiologist: CREWS, DAVID   Procedure Name: LMA Insertion Date/Time: 06/21/2015 10:14 AM Performed by: Trenton DesanctisLINKA, Adamari Frede L Pre-anesthesia Checklist: Patient identified, Emergency Drugs available, Suction available, Patient being monitored and Timeout performed Patient Re-evaluated:Patient Re-evaluated prior to inductionOxygen Delivery Method: Circle System Utilized Preoxygenation: Pre-oxygenation with 100% oxygen Intubation Type: IV induction Ventilation: Mask ventilation without difficulty LMA: LMA inserted LMA Size: 4.0 Number of attempts: 1 Airway Equipment and Method: Bite block Placement Confirmation: positive ETCO2 Tube secured with: Tape Dental Injury: Teeth and Oropharynx as per pre-operative assessment

## 2015-06-22 ENCOUNTER — Encounter (HOSPITAL_BASED_OUTPATIENT_CLINIC_OR_DEPARTMENT_OTHER): Payer: Self-pay | Admitting: Orthopaedic Surgery

## 2015-06-22 DIAGNOSIS — S83512A Sprain of anterior cruciate ligament of left knee, initial encounter: Secondary | ICD-10-CM | POA: Diagnosis not present

## 2016-12-01 IMAGING — MR MR KNEE*L* W/O CM
6 series · 34 of 40 positions shown · non-contrast
Comparison: 02/22/2014

CLINICAL DATA: Left knee pain.  Status post fall.

EXAM:
MRI OF THE LEFT KNEE WITHOUT CONTRAST
TECHNIQUE: Multiplanar, multisequence MR imaging of the knee was performed. No
intravenous contrast was administered.

[Series 6: PD fat-sat · axial · left · 3.0mm · 0.36mm/px · z∈[-47,+48]mm · 8 of 30 slices shown (1 of 3)]
[im 1/30]
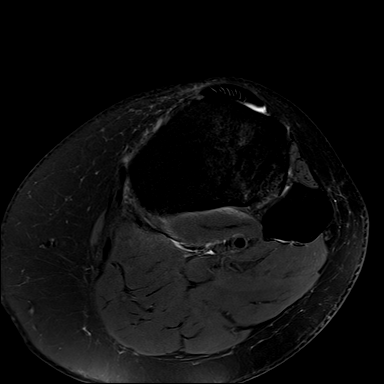
[im 5/30]
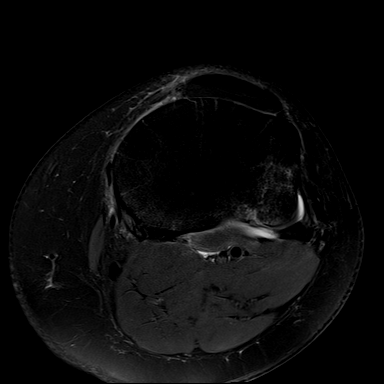
[im 9/30]
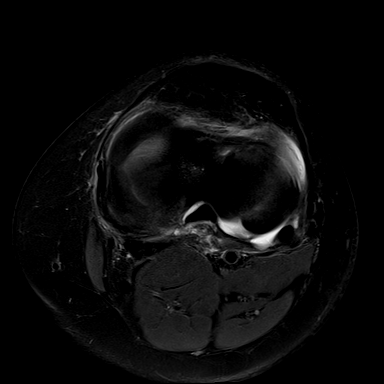
[im 13/30]
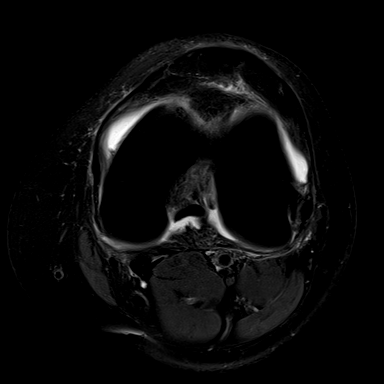
[im 17/30]
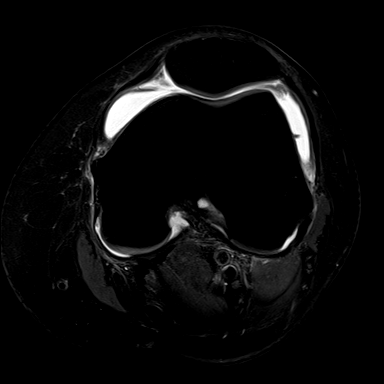
[im 21/30]
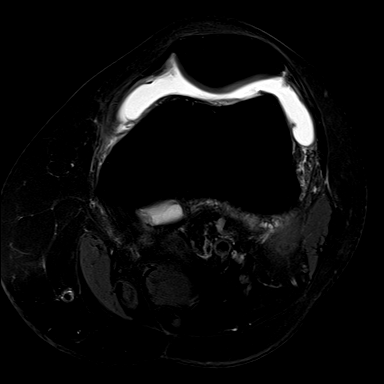
[im 25/30]
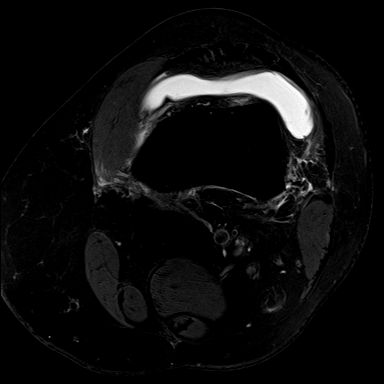
[im 30/30]
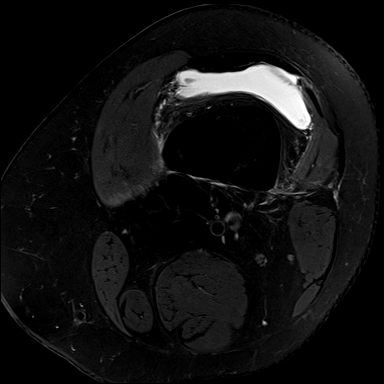

[Series 7: T1 · coronal · left · 3.0mm · 0.31mm/px · 1 of 30 slices shown]
[im 1/30]
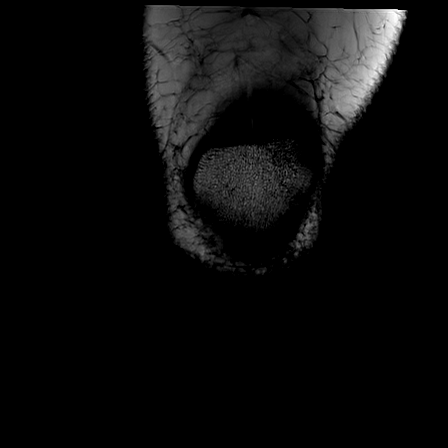

[Series 8: PD fat-sat · coronal · left · 3.0mm · 0.36mm/px · 7 of 30 slices shown (2 of 3)]
[im 1/30]
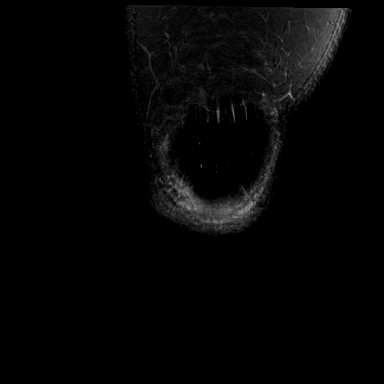
[im 5/30]
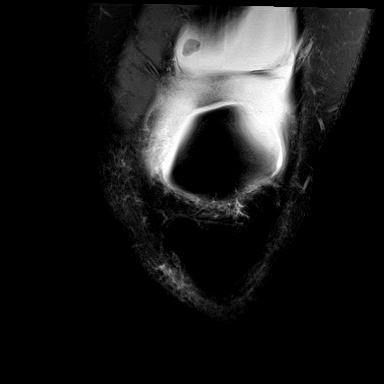
[im 10/30]
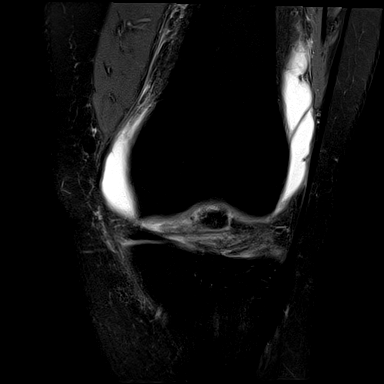
[im 15/30]
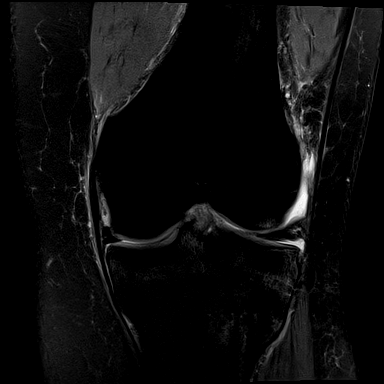
[im 20/30]
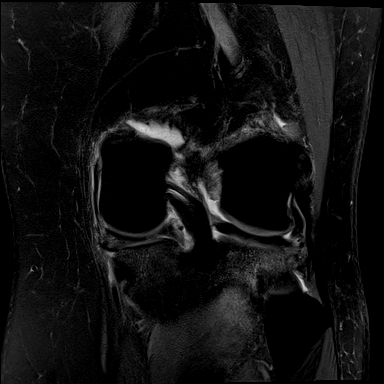
[im 25/30]
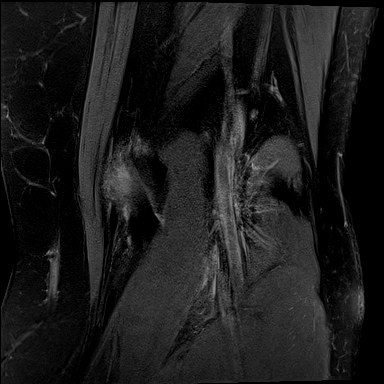
[im 30/30]
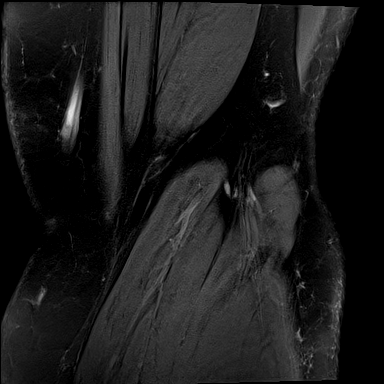

[Series 9: PD fat-sat · sagittal · left · 3.0mm · 0.39mm/px · 6 of 27 slices shown (3 of 3)]
[im 1/27]
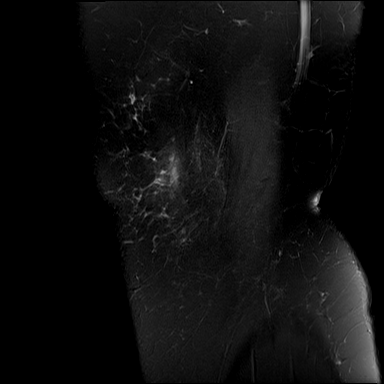
[im 6/27]
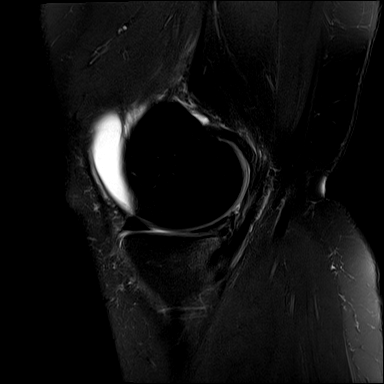
[im 11/27]
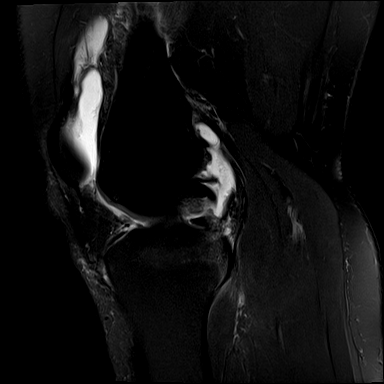
[im 16/27]
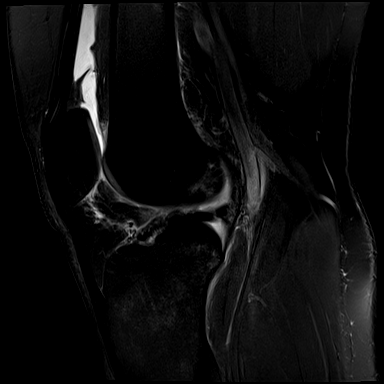
[im 21/27]
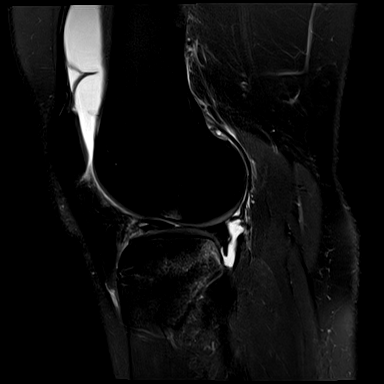
[im 27/27]
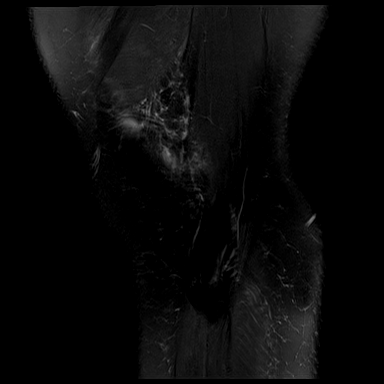

[Series 10: T2 fat-sat · coronal · left · 3.0mm · 0.36mm/px · 7 of 30 slices shown]
[im 1/30]
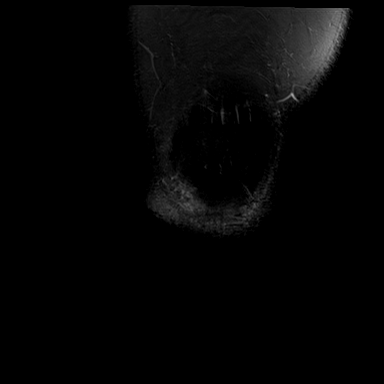
[im 5/30]
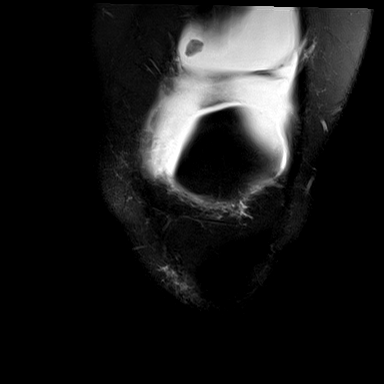
[im 10/30]
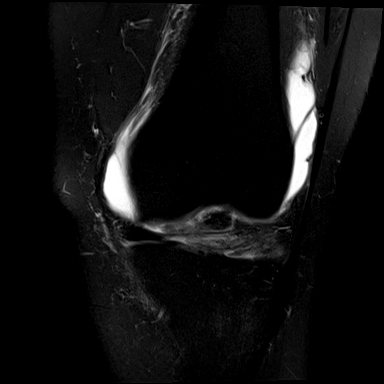
[im 15/30]
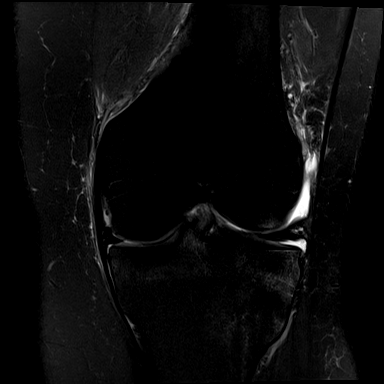
[im 20/30]
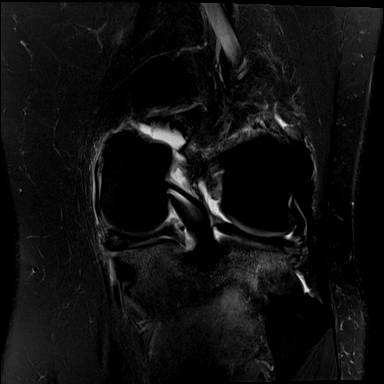
[im 25/30]
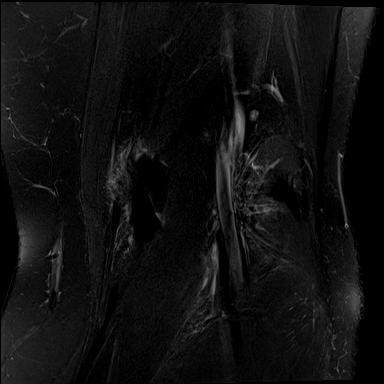
[im 30/30]
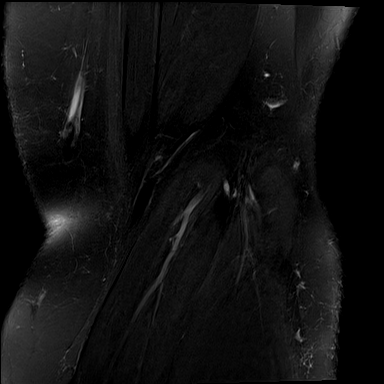

[Series 11: PD · oblique · left · 1.5mm · 0.44mm/px · 5 of 23 slices shown]
[im 1/23]
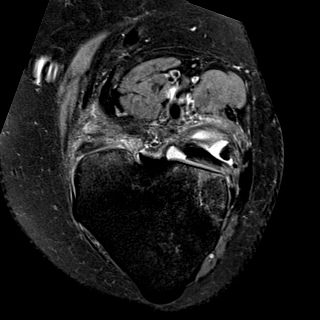
[im 6/23]
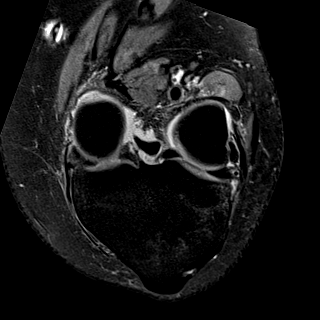
[im 12/23]
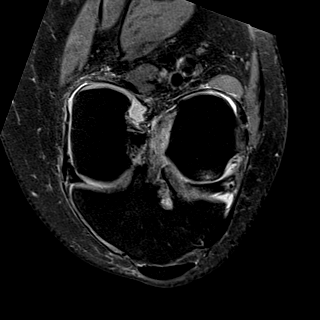
[im 17/23]
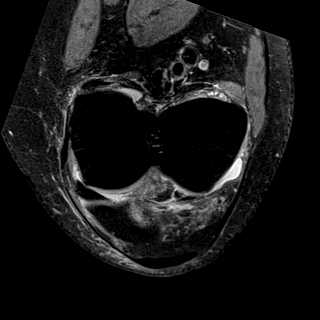
[im 23/23]
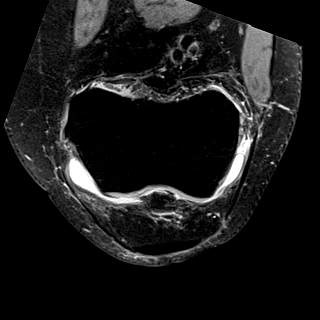

[34 of 40 positions shown; findings below may reference images not displayed]

FINDINGS: MENISCI

Medial meniscus: Complex tear of the posterior horn of the medial
meniscus with a radial component.

Lateral meniscus: Flap tear of the posterior peripheral undersurface
of the body of the lateral meniscus (image 21/series 9).

LIGAMENTS

Cruciates:  Complete ACL tear.  Intact PCL.

Collaterals: Small amount of fluid superficial to the MCL. The MCL
is otherwise intact. Lateral collateral ligament complex is intact.

CARTILAGE

Patellofemoral:  No chondral defect.

Medial:  No chondral defect.

Lateral:  No chondral defect.

Joint: Large joint effusion. Small loose body in the suprapatellar
joint space measuring 5 mm. Mild edema in Hoffa's fat. No plical
thickening.

Popliteal Fossa:  Intact popliteus tendon.  No Baker cyst.

Extensor Mechanism:  Intact.

Bones: Osseous contusion of the posterior lateral tibial plateau and
anterolateral femoral condyle. Mild marrow contusion of the
posterior medial tibial plateau. No acute fracture or dislocation.
IMPRESSION: 1. Complete ACL tear.
2. Complex tear of the posterior horn of the medial meniscus with a
radial component.
3. Flap tear of the posterior peripheral undersurface of the body of
the lateral meniscus (image 21/series 9).
4. Small amount of fluid superficial to the MCL most consistent with
grade 1 injury without disruption.
5. Large joint effusion.
6. Osseous contusions of the posterior lateral tibial plateau,
anterolateral femoral condyle and posteromedial tibial plateau.

## 2017-09-18 ENCOUNTER — Encounter (INDEPENDENT_AMBULATORY_CARE_PROVIDER_SITE_OTHER): Payer: Self-pay | Admitting: Orthopaedic Surgery

## 2017-09-18 ENCOUNTER — Ambulatory Visit (INDEPENDENT_AMBULATORY_CARE_PROVIDER_SITE_OTHER): Payer: BLUE CROSS/BLUE SHIELD | Admitting: Orthopaedic Surgery

## 2017-09-18 ENCOUNTER — Ambulatory Visit (INDEPENDENT_AMBULATORY_CARE_PROVIDER_SITE_OTHER): Payer: BLUE CROSS/BLUE SHIELD

## 2017-09-18 DIAGNOSIS — M1711 Unilateral primary osteoarthritis, right knee: Secondary | ICD-10-CM | POA: Insufficient documentation

## 2017-09-18 MED ORDER — METHYLPREDNISOLONE ACETATE 40 MG/ML IJ SUSP
40.0000 mg | INTRAMUSCULAR | Status: AC | PRN
  Administered 2017-09-18: 40 mg via INTRA_ARTICULAR

## 2017-09-18 MED ORDER — LIDOCAINE HCL 1 % IJ SOLN
2.0000 mL | INTRAMUSCULAR | Status: AC | PRN
  Administered 2017-09-18: 2 mL

## 2017-09-18 MED ORDER — BUPIVACAINE HCL 0.25 % IJ SOLN
2.0000 mL | INTRAMUSCULAR | Status: AC | PRN
  Administered 2017-09-18: 2 mL via INTRA_ARTICULAR

## 2017-09-18 NOTE — Progress Notes (Signed)
Office Visit Note   Patient: Laura Zimmerman           Date of Birth: 02-Feb-1981           MRN: [id number] Visit Date: 09/18/2017              Requested by: Tanna FurryHout, Brittany, PA-C [address] [place], [place] [id number] PCP: Tanna FurryHout, Brittany, PA-C   Assessment & Plan: Visit Diagnoses:  1. Primary localized osteoarthritis of right knee     Plan: Impression is primary localized osteoarthritis to the right knee.  Today, we will inject her right knee with cortisone.  End of the day this will come down to a total knee replacement and the patient is aware.  We have discussed the possibility of Visco supplementation injections of cortisone fails to improve, however I do not know how helpful this will be.  She will call with concerns or questions in the meantime.  Follow-Up Instructions: Return if symptoms worsen or fail to improve.   Orders:  Orders Placed This Encounter  Procedures  . Large Joint Inj: R knee  . XR KNEE 3 VIEW RIGHT   No orders of the defined types were placed in this encounter.     Procedures: Large Joint Inj: R knee on 09/18/2017 9:44 AM Indications: pain Details: 22 G needle, anterolateral approach Medications: 2 mL lidocaine 1 %; 2 mL bupivacaine 0.25 %; 40 mg methylPREDNISolone acetate 40 MG/ML      Clinical Data: No additional findings.   Subjective: Chief Complaint  Patient presents with  . Right Knee - Pain, Follow-up    HPI Laura Zimmerman comes in with recurrent right knee pain.  This is been ongoing for about 20 years.  She has had 4 knee arthroscopies dating back to 1993 and aspirated when she states they removed all of her medial lateral meniscus.  Since then she has had pain that is progressively worsened.  The majority of her pain is to the anterolateral aspect.  She describes this as a constant ache with a sensation of her kneecap popping out of place.  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  She does take Advil with moderate relief of symptoms.  No previous cortisone injection to the right knee.  Review of Systems as detailed in HPI.  All others are negative.   Objective: Vital Signs: There were no vitals taken for this visit.  Physical Exam well-developed well-nourished female no acute distress.  Alert and oriented x3.  Ortho Exam examination of her right knee reveals a trace effusion.  Range of motion 0-100 degrees.  Marked patellofemoral crepitus.  She is stable valgus varus stress.  She is rest intact distally.  Specialty Comments:  No specialty comments available.  Imaging: Xr Knee 3 View Right  Result Date: 09/18/2017 X-rays show marked tricompartmental degenerative changes with periarticular spurring.    PMFS History: Patient Active Problem List   Diagnosis Date Noted  . Primary localized osteoarthritis of right knee 09/18/2017  . ACL (anterior cruciate ligament) rupture 06/21/2015   Past Medical History:  Diagnosis Date  . ACL tear 06/2015  . Dry mouth   . GERD (gastroesophageal reflux disease)   . Lateral meniscal tear 06/2015   left knee  . Medial meniscus tear 06/2015   left knee  . Peripheral edema    lower extremities - states no HTN    History  reviewed. No pertinent family history.  Past Surgical History:  Procedure Laterality Date  . ANAL FISTULECTOMY    . ANTERIOR CRUCIATE LIGAMENT REPAIR Left 06/21/2015   Procedure: LEFT KNEE ARTHROSCOPY WITH ANTERIOR CRUCIATE LIGAMENT (ACL) RECONSTRUCTION WITH GRAFTLINK ALLOGRAFT, PARTIAL MEDIAL MENISCECTOMY AND PARTIAL LATERAL MENISCECTOMY;  Surgeon: Tarry Kos, MD;  Location: [facility];  Service: Orthopedics;  Laterality: Left;  . DIAGNOSTIC LAPAROSCOPY  03/20/2015  . KNEE ARTHROSCOPY Right    x 4  . KNEE ARTHROSCOPY WITH LATERAL MENISECTOMY Left 06/21/2015   Procedure: KNEE ARTHROSCOPY WITH LATERAL MENISECTOMY;  Surgeon: Tarry Kos, MD;  Location: [facility];  Service: Orthopedics;  Laterality: Left;  . KNEE ARTHROSCOPY WITH MEDIAL MENISECTOMY Left 06/21/2015   Procedure: KNEE ARTHROSCOPY WITH MEDIAL MENISECTOMY;  Surgeon: Tarry Kos, MD;  Location: [facility];  Service: Orthopedics;  Laterality: Left;  . TONSILLECTOMY     Social History   Occupational History  . Not on file  Tobacco Use  . Smoking status: Never Smoker  . Smokeless tobacco: Never Used  Substance and Sexual Activity  . Alcohol use: No  . Drug use: No  . Sexual activity: Not on file

## 2017-12-04 ENCOUNTER — Ambulatory Visit (INDEPENDENT_AMBULATORY_CARE_PROVIDER_SITE_OTHER): Payer: BLUE CROSS/BLUE SHIELD | Admitting: Orthopaedic Surgery

## 2018-03-19 ENCOUNTER — Ambulatory Visit (INDEPENDENT_AMBULATORY_CARE_PROVIDER_SITE_OTHER): Payer: BLUE CROSS/BLUE SHIELD | Admitting: Orthopaedic Surgery

## 2018-03-20 ENCOUNTER — Telehealth (INDEPENDENT_AMBULATORY_CARE_PROVIDER_SITE_OTHER): Payer: Self-pay | Admitting: Radiology

## 2018-03-20 ENCOUNTER — Ambulatory Visit (INDEPENDENT_AMBULATORY_CARE_PROVIDER_SITE_OTHER): Payer: BLUE CROSS/BLUE SHIELD | Admitting: Orthopaedic Surgery

## 2018-03-20 ENCOUNTER — Encounter (INDEPENDENT_AMBULATORY_CARE_PROVIDER_SITE_OTHER): Payer: Self-pay | Admitting: Orthopaedic Surgery

## 2018-03-20 DIAGNOSIS — M1711 Unilateral primary osteoarthritis, right knee: Secondary | ICD-10-CM | POA: Diagnosis not present

## 2018-03-20 MED ORDER — LIDOCAINE HCL 1 % IJ SOLN
2.0000 mL | INTRAMUSCULAR | Status: AC | PRN
  Administered 2018-03-20: 2 mL

## 2018-03-20 MED ORDER — METHYLPREDNISOLONE ACETATE 40 MG/ML IJ SUSP
40.0000 mg | INTRAMUSCULAR | Status: AC | PRN
  Administered 2018-03-20: 40 mg via INTRA_ARTICULAR

## 2018-03-20 MED ORDER — BUPIVACAINE HCL 0.25 % IJ SOLN
2.0000 mL | INTRAMUSCULAR | Status: AC | PRN
  Administered 2018-03-20: 2 mL via INTRA_ARTICULAR

## 2018-03-20 NOTE — Telephone Encounter (Signed)
Please get auth for Gel injection for the right knee.

## 2018-03-20 NOTE — Progress Notes (Signed)
Office Visit Note   Patient: Laura Zimmerman           Date of Birth: Aug 15, 1980           MRN: [id number] Visit Date: 03/20/2018              Requested by: Tanna Furry, PA-C [address] [place], [place] [address] PCP: Tanna Furry, PA-C   Assessment & Plan: Visit Diagnoses:  1. Primary localized osteoarthritis of right knee     Plan: Impression is right knee posttraumatic osteoarthritis.  Today we aspirated 15 cc of serosanguineous fluid from the right knee joint.  I then injected the knee joint with cortisone.  We will send for Visco supplementation approval in the meantime.  She will follow-up with [place] once she has been approved.  In the meantime, she will make all efforts at weight loss.  Call with concerns or questions.  Follow-Up Instructions: Return if symptoms worsen or fail to improve.   Orders:  Orders Placed This Encounter  Procedures  . Large Joint Inj: R knee   No orders of the defined types were placed in this encounter.     Procedures: Large Joint Inj: R knee on 03/20/2018 10:49 AM Indications: pain Details: 22 G needle, anterolateral approach Medications: 2 mL lidocaine 1 %; 2 mL bupivacaine 0.25 %; 40 mg methylPREDNISolone acetate 40 MG/ML      Clinical Data: No additional findings.   Subjective: Chief Complaint  Patient presents with  . Right Knee - Pain    HPI patient is a pleasant 37 year old female who presents to our clinic today with recurrent right knee pain.  History of right knee posttraumatic arthritis from an injury occurring several years ago.  We saw her in our clinic in March 2019 where we proceeded with an intra-articular cortisone injection.  This gave her moderate relief of symptoms until recently.  Her pain has returned and has started to worsen.  Pain is worse lateral aspect.  She does have increased pain going up and down stairs as well as with squatting.  This is starting to affect her ADLs as well as her work as she works as a  Engineer, civil (consulting).  She has recently tried over-the-counter medications without relief of symptoms.  She would like a repeat cortisone injection today.  Review of Systems as detailed in HPI.  All others reviewed and are negative.   Objective: Vital Signs: There were no vitals taken for this visit.  Physical Exam well-developed well-nourished female no acute distress.  Alert and oriented x3.  Ortho Exam stable exam of the right knee.  Specialty Comments:  No specialty comments available.  Imaging: No new imaging   PMFS History: Patient Active Problem List   Diagnosis Date Noted  . Primary localized osteoarthritis of right knee 09/18/2017  . ACL (anterior cruciate ligament) rupture 06/21/2015   Past Medical History:  Diagnosis Date  . ACL tear 06/2015  . Dry mouth   . GERD (gastroesophageal reflux disease)   . Lateral meniscal tear 06/2015   left knee  . Medial meniscus tear 06/2015   left knee  . Peripheral edema    lower extremities - states no HTN    History reviewed. No pertinent family history.  Past Surgical History:  Procedure Laterality Date  . ANAL FISTULECTOMY    . ANTERIOR CRUCIATE LIGAMENT REPAIR Left 06/21/2015   Procedure: LEFT KNEE ARTHROSCOPY WITH ANTERIOR CRUCIATE LIGAMENT (ACL) RECONSTRUCTION WITH GRAFTLINK ALLOGRAFT, PARTIAL MEDIAL MENISCECTOMY AND PARTIAL LATERAL  MENISCECTOMY;  Surgeon: Tarry KosNaiping M Cola Gane, MD;  Location: [facility];  Service: Orthopedics;  Laterality: Left;  . DIAGNOSTIC LAPAROSCOPY  03/20/2015  . KNEE ARTHROSCOPY Right    x 4  . KNEE ARTHROSCOPY WITH LATERAL MENISECTOMY Left 06/21/2015   Procedure: KNEE ARTHROSCOPY WITH LATERAL MENISECTOMY;  Surgeon: Tarry KosNaiping M Margel Joens, MD;  Location: [facility];  Service: Orthopedics;  Laterality: Left;  . KNEE ARTHROSCOPY WITH MEDIAL MENISECTOMY Left 06/21/2015   Procedure: KNEE ARTHROSCOPY WITH MEDIAL MENISECTOMY;  Surgeon: Tarry KosNaiping M Garnett Nunziata, MD;  Location: [facility];  Service:  Orthopedics;  Laterality: Left;  . TONSILLECTOMY     Social History   Occupational History  . Not on file  Tobacco Use  . Smoking status: Never Smoker  . Smokeless tobacco: Never Used  Substance and Sexual Activity  . Alcohol use: No  . Drug use: No  . Sexual activity: Not on file

## 2018-03-20 NOTE — Telephone Encounter (Signed)
Noted  

## 2018-03-26 ENCOUNTER — Telehealth (INDEPENDENT_AMBULATORY_CARE_PROVIDER_SITE_OTHER): Payer: Self-pay | Admitting: Orthopaedic Surgery

## 2018-03-26 NOTE — Telephone Encounter (Signed)
See message below °

## 2018-03-26 NOTE — Telephone Encounter (Signed)
Patient called having a lot of pain in right knee. Patient was seen on the 20th of September.

## 2018-03-26 NOTE — Telephone Encounter (Signed)
Called patient to let her know

## 2018-03-26 NOTE — Telephone Encounter (Signed)
Sounds like a post injection flare up.  Rest, ice, advil 800 mg tid.

## 2018-04-03 ENCOUNTER — Ambulatory Visit (INDEPENDENT_AMBULATORY_CARE_PROVIDER_SITE_OTHER): Payer: BLUE CROSS/BLUE SHIELD | Admitting: Orthopaedic Surgery

## 2018-04-06 ENCOUNTER — Telehealth (INDEPENDENT_AMBULATORY_CARE_PROVIDER_SITE_OTHER): Payer: Self-pay

## 2018-04-06 NOTE — Telephone Encounter (Signed)
Submitted VOB for SynviscOne, right knee. 

## 2018-04-07 ENCOUNTER — Ambulatory Visit (INDEPENDENT_AMBULATORY_CARE_PROVIDER_SITE_OTHER): Payer: BLUE CROSS/BLUE SHIELD | Admitting: Orthopaedic Surgery

## 2018-04-10 ENCOUNTER — Telehealth (INDEPENDENT_AMBULATORY_CARE_PROVIDER_SITE_OTHER): Payer: Self-pay

## 2018-04-10 NOTE — Telephone Encounter (Signed)
PA required for SynviscOne, right knee. Faxed completed PA form to [employer] at [phone]. 

## 2018-04-15 ENCOUNTER — Telehealth (INDEPENDENT_AMBULATORY_CARE_PROVIDER_SITE_OTHER): Payer: Self-pay

## 2018-04-15 NOTE — Telephone Encounter (Signed)
Talked with patient and advised her that she is approved for SynviscOne, right knee. Cut Off Covered at 60% after deductible has been met Patient will be responsible for 40% OOP. PA required PA Approval# [id number] Valid 04/10/2018- 04/10/2019  Appt. 04/21/2018

## 2018-04-21 ENCOUNTER — Ambulatory Visit (INDEPENDENT_AMBULATORY_CARE_PROVIDER_SITE_OTHER): Payer: BLUE CROSS/BLUE SHIELD | Admitting: Orthopaedic Surgery

## 2018-04-21 ENCOUNTER — Encounter (INDEPENDENT_AMBULATORY_CARE_PROVIDER_SITE_OTHER): Payer: Self-pay | Admitting: Orthopaedic Surgery

## 2018-04-21 DIAGNOSIS — M1711 Unilateral primary osteoarthritis, right knee: Secondary | ICD-10-CM

## 2018-04-21 MED ORDER — BUPIVACAINE HCL 0.25 % IJ SOLN
2.0000 mL | INTRAMUSCULAR | Status: AC | PRN
Start: 2018-04-21 — End: 2018-04-21
  Administered 2018-04-21: 2 mL via INTRA_ARTICULAR

## 2018-04-21 MED ORDER — LIDOCAINE HCL 1 % IJ SOLN
2.0000 mL | INTRAMUSCULAR | Status: AC | PRN
Start: 2018-04-21 — End: 2018-04-21
  Administered 2018-04-21: 2 mL

## 2018-04-21 MED ORDER — HYLAN G-F 20 48 MG/6ML IX SOSY
48.0000 mg | PREFILLED_SYRINGE | INTRA_ARTICULAR | Status: AC | PRN
  Administered 2018-04-21: 48 mg via INTRA_ARTICULAR

## 2018-04-21 NOTE — Progress Notes (Signed)
   Procedure Note  Patient: Laura Zimmerman             Date of Birth: 04/24/1981           MRN: [id number]             Visit Date: 04/21/2018  Procedures: Visit Diagnoses: Primary localized osteoarthritis of right knee - Plan: Large Joint Inj: R knee  Large Joint Inj: R knee on 04/21/2018 10:00 AM Indications: pain Details: 22 G needle, anterolateral approach Medications: 2 mL lidocaine 1 %; 2 mL bupivacaine 0.25 %; 48 mg Hylan 48 MG/6ML

## 2021-08-29 ENCOUNTER — Ambulatory Visit: Payer: Self-pay | Admitting: Physician Assistant

## 2024-02-26 ENCOUNTER — Ambulatory Visit: Admitting: Allergy and Immunology
